# Patient Record
Sex: Female | Born: 1970 | Hispanic: Yes | Smoking: Never smoker
Health system: Southern US, Community
[De-identification: ages and names within clinical notes are randomized; demographics above are authoritative.]

## PROBLEM LIST (undated history)

## (undated) DIAGNOSIS — J45909 Unspecified asthma, uncomplicated: Secondary | ICD-10-CM

## (undated) HISTORY — DX: Unspecified asthma, uncomplicated: J45.909

---

## 2010-12-18 DIAGNOSIS — F32A Depression, unspecified: Secondary | ICD-10-CM | POA: Insufficient documentation

## 2010-12-18 DIAGNOSIS — R7303 Prediabetes: Secondary | ICD-10-CM | POA: Insufficient documentation

## 2010-12-18 DIAGNOSIS — E785 Hyperlipidemia, unspecified: Secondary | ICD-10-CM | POA: Insufficient documentation

## 2010-12-18 DIAGNOSIS — E559 Vitamin D deficiency, unspecified: Secondary | ICD-10-CM | POA: Insufficient documentation

## 2013-02-27 DIAGNOSIS — M546 Pain in thoracic spine: Secondary | ICD-10-CM | POA: Insufficient documentation

## 2015-09-07 DIAGNOSIS — R911 Solitary pulmonary nodule: Secondary | ICD-10-CM | POA: Insufficient documentation

## 2015-09-28 DIAGNOSIS — D3610 Benign neoplasm of peripheral nerves and autonomic nervous system, unspecified: Secondary | ICD-10-CM | POA: Insufficient documentation

## 2015-09-28 DIAGNOSIS — G902 Horner's syndrome: Secondary | ICD-10-CM | POA: Insufficient documentation

## 2015-10-04 DIAGNOSIS — N939 Abnormal uterine and vaginal bleeding, unspecified: Secondary | ICD-10-CM | POA: Insufficient documentation

## 2015-10-04 DIAGNOSIS — R918 Other nonspecific abnormal finding of lung field: Secondary | ICD-10-CM | POA: Insufficient documentation

## 2015-11-23 DIAGNOSIS — E669 Obesity, unspecified: Secondary | ICD-10-CM | POA: Insufficient documentation

## 2019-01-12 DIAGNOSIS — R109 Unspecified abdominal pain: Secondary | ICD-10-CM | POA: Insufficient documentation

## 2019-01-12 DIAGNOSIS — R0600 Dyspnea, unspecified: Secondary | ICD-10-CM | POA: Insufficient documentation

## 2019-02-06 DIAGNOSIS — L817 Pigmented purpuric dermatosis: Secondary | ICD-10-CM | POA: Insufficient documentation

## 2019-02-06 DIAGNOSIS — D179 Benign lipomatous neoplasm, unspecified: Secondary | ICD-10-CM | POA: Insufficient documentation

## 2019-02-06 DIAGNOSIS — Z0000 Encounter for general adult medical examination without abnormal findings: Secondary | ICD-10-CM | POA: Insufficient documentation

## 2019-12-08 ENCOUNTER — Other Ambulatory Visit: Payer: Self-pay

## 2019-12-08 ENCOUNTER — Ambulatory Visit
Admission: RE | Admit: 2019-12-08 | Discharge: 2019-12-08 | Disposition: A | Discharge status: Home / Self Care | Payer: Self-pay | Source: Ambulatory Visit | Attending: Oncology | Admitting: Oncology

## 2019-12-08 ENCOUNTER — Encounter: Payer: Self-pay | Admitting: *Deleted

## 2019-12-08 ENCOUNTER — Ambulatory Visit: Payer: Self-pay | Attending: Oncology | Admitting: *Deleted

## 2019-12-08 VITALS — BP 122/72 | HR 98 | Temp 99.1°F | Ht <= 58 in | Wt 133.0 lb

## 2019-12-08 DIAGNOSIS — Z0000 Encounter for general adult medical examination without abnormal findings: Secondary | ICD-10-CM

## 2019-12-08 NOTE — Patient Instructions (Signed)
Gave patient hand-out, Women Staying Healthy, Active and Well from [facility], with education on breast health, pap smears, heart and colon health. 

## 2019-12-08 NOTE — Progress Notes (Signed)
  Subjective:     Patient ID: Mandy Ballard, female   DOB: April 17, 1971, 49 y.o.   MRN: [id number]  HPI   [facility] Medical History Record - 12/08/19 1142      Breast History   Screening cycle New    CBE Date 12/08/14    Provider (CBE) [facility]    Initial Mammogram 12/08/19    Last Mammogram --   10-18 years ago   Provider (Mammogram)  [facility]    Recent Breast Symptoms --   sometimes she has bilateral breast pain     Breast Cancer History   Breast Cancer History No personal or family history      Previous History of Breast Problems   Breast Surgery or Biopsy None    Breast Implants N/A    BSE Done Monthly   occassionally     Gynecological/Obstetrical History   LMP --   IUD 4 years ago and has not had a period   Age at menarche 43    Age at menopause NA    PAP smear history Annually    Date of last PAP  03/10/19    Provider (PAP) [facility]    Age at first live birth 48    Breast fed children Yes (type length in comments)   6 months   Cervical, Uterine or Ovarian cancer No    Family history of Cervial, Uterine or Ovarian cancer No    Hysterectomy No    Cervix removed No    Ovaries removed No    Laser/Cryosurgery No    Current method of birth control Other (see comments)   Mirena   Current method of Estrogen/Hormone replacement None    Smoking history None             Review of Systems     Objective:   Physical Exam Chest:     Breasts:        Right: Tenderness present. No swelling, bleeding, inverted nipple, mass, nipple discharge or skin change.        Left: Tenderness present. No swelling, bleeding, inverted nipple, mass, nipple discharge or skin change.  Lymphadenopathy:     Upper Body:     Right upper body: No supraclavicular or axillary adenopathy.     Left upper body: No supraclavicular or axillary adenopathy.        Assessment:     49 year old Hispanic female presents to [facility] for clinical breast exam and mammogram.   Loyda, the interpreter present during the interview and exam.  Patient complains of occassional bilateral breast tenderness.  Clinical breast exam unremarkable. Taught self breast awareness.  Last pap smear on 03/10/19 was negative without HPV co-testing.  Next pap due in 2023.  Patient has been screened for eligibility.  She does not have any insurance, Medicare or Medicaid.  She also meets financial eligibility.   Risk Assessment    Risk Scores      12/08/2019   Last edited by: Jim Like, RN   5-year risk: 0.6 %   Lifetime risk: 5.8 %            Plan:     Screening mammogram ordered.  Will follow up per [facility] protocol.

## 2019-12-09 ENCOUNTER — Other Ambulatory Visit: Payer: Self-pay | Admitting: *Deleted

## 2019-12-09 DIAGNOSIS — N63 Unspecified lump in unspecified breast: Secondary | ICD-10-CM

## 2019-12-24 ENCOUNTER — Ambulatory Visit
Admission: RE | Admit: 2019-12-24 | Discharge: 2019-12-24 | Disposition: A | Discharge status: Home / Self Care | Payer: Self-pay | Source: Ambulatory Visit | Attending: Oncology | Admitting: Oncology

## 2019-12-24 ENCOUNTER — Other Ambulatory Visit: Payer: Self-pay

## 2019-12-24 DIAGNOSIS — N63 Unspecified lump in unspecified breast: Secondary | ICD-10-CM

## 2019-12-28 ENCOUNTER — Other Ambulatory Visit: Payer: Self-pay

## 2019-12-28 DIAGNOSIS — R920 Mammographic microcalcification found on diagnostic imaging of breast: Secondary | ICD-10-CM

## 2019-12-28 DIAGNOSIS — N63 Unspecified lump in unspecified breast: Secondary | ICD-10-CM

## 2020-01-01 ENCOUNTER — Ambulatory Visit
Admission: RE | Admit: 2020-01-01 | Discharge: 2020-01-01 | Disposition: A | Discharge status: Home / Self Care | Payer: Self-pay | Source: Ambulatory Visit | Attending: Oncology | Admitting: Oncology

## 2020-01-01 ENCOUNTER — Other Ambulatory Visit: Payer: Self-pay

## 2020-01-01 DIAGNOSIS — R920 Mammographic microcalcification found on diagnostic imaging of breast: Secondary | ICD-10-CM | POA: Insufficient documentation

## 2020-01-01 HISTORY — PX: BREAST BIOPSY: SHX20

## 2020-01-05 LAB — SURGICAL PATHOLOGY

## 2020-01-21 DIAGNOSIS — Z309 Encounter for contraceptive management, unspecified: Secondary | ICD-10-CM | POA: Insufficient documentation

## 2020-01-27 ENCOUNTER — Encounter: Payer: Self-pay | Admitting: *Deleted

## 2020-01-27 NOTE — Progress Notes (Signed)
Letter mailed to inform patient of her six month follow up mammogram on 07/05/19 @ 10:20.

## 2020-02-02 DIAGNOSIS — Z87898 Personal history of other specified conditions: Secondary | ICD-10-CM | POA: Insufficient documentation

## 2020-02-25 DIAGNOSIS — E663 Overweight: Secondary | ICD-10-CM | POA: Insufficient documentation

## 2020-02-25 DIAGNOSIS — G44209 Tension-type headache, unspecified, not intractable: Secondary | ICD-10-CM | POA: Insufficient documentation

## 2020-07-04 ENCOUNTER — Ambulatory Visit
Admission: RE | Admit: 2020-07-04 | Discharge: 2020-07-04 | Disposition: A | Discharge status: Home / Self Care | Payer: Self-pay | Source: Ambulatory Visit | Attending: Oncology | Admitting: Oncology

## 2020-07-04 ENCOUNTER — Other Ambulatory Visit: Payer: Self-pay

## 2020-07-04 DIAGNOSIS — N63 Unspecified lump in unspecified breast: Secondary | ICD-10-CM | POA: Insufficient documentation

## 2020-07-21 ENCOUNTER — Encounter: Payer: Self-pay | Admitting: *Deleted

## 2020-07-21 ENCOUNTER — Other Ambulatory Visit: Payer: Self-pay | Admitting: *Deleted

## 2020-07-21 DIAGNOSIS — N6489 Other specified disorders of breast: Secondary | ICD-10-CM

## 2020-07-21 NOTE — Progress Notes (Signed)
Letter mailed to inform patient of her mammogram results and need to return in 6 months.  Appointment scheduled for 01/03/21 @ 8:30 for [facility] and 10:00 for her mammogram.

## 2020-08-08 DIAGNOSIS — R609 Edema, unspecified: Secondary | ICD-10-CM | POA: Insufficient documentation

## 2020-08-16 DIAGNOSIS — R252 Cramp and spasm: Secondary | ICD-10-CM | POA: Insufficient documentation

## 2020-08-16 DIAGNOSIS — N912 Amenorrhea, unspecified: Secondary | ICD-10-CM | POA: Insufficient documentation

## 2020-08-16 DIAGNOSIS — R7989 Other specified abnormal findings of blood chemistry: Secondary | ICD-10-CM | POA: Insufficient documentation

## 2020-11-18 DIAGNOSIS — R059 Cough, unspecified: Secondary | ICD-10-CM | POA: Insufficient documentation

## 2021-01-03 ENCOUNTER — Ambulatory Visit
Admission: RE | Admit: 2021-01-03 | Discharge: 2021-01-03 | Disposition: A | Discharge status: Home / Self Care | Payer: Self-pay | Source: Ambulatory Visit | Attending: Oncology | Admitting: Oncology

## 2021-01-03 ENCOUNTER — Other Ambulatory Visit: Payer: Self-pay

## 2021-01-03 DIAGNOSIS — N6489 Other specified disorders of breast: Secondary | ICD-10-CM | POA: Insufficient documentation

## 2021-01-05 ENCOUNTER — Encounter: Payer: Self-pay | Admitting: *Deleted

## 2021-06-09 DIAGNOSIS — M2550 Pain in unspecified joint: Secondary | ICD-10-CM | POA: Insufficient documentation

## 2021-07-05 ENCOUNTER — Ambulatory Visit: Payer: Self-pay

## 2021-07-14 DIAGNOSIS — Z1321 Encounter for screening for nutritional disorder: Secondary | ICD-10-CM | POA: Insufficient documentation

## 2021-08-11 DIAGNOSIS — M7701 Medial epicondylitis, right elbow: Secondary | ICD-10-CM | POA: Insufficient documentation

## 2021-09-22 DIAGNOSIS — H04129 Dry eye syndrome of unspecified lacrimal gland: Secondary | ICD-10-CM | POA: Insufficient documentation

## 2022-01-03 ENCOUNTER — Ambulatory Visit: Payer: Self-pay

## 2022-01-30 ENCOUNTER — Ambulatory Visit: Payer: Self-pay

## 2022-02-02 ENCOUNTER — Other Ambulatory Visit: Payer: Self-pay

## 2022-02-02 DIAGNOSIS — N6489 Other specified disorders of breast: Secondary | ICD-10-CM

## 2022-02-06 ENCOUNTER — Ambulatory Visit
Admission: RE | Admit: 2022-02-06 | Discharge: 2022-02-06 | Disposition: A | Discharge status: Home / Self Care | Payer: Self-pay | Source: Ambulatory Visit | Attending: Obstetrics and Gynecology | Admitting: Obstetrics and Gynecology

## 2022-02-06 ENCOUNTER — Other Ambulatory Visit: Payer: Self-pay

## 2022-02-06 ENCOUNTER — Ambulatory Visit: Payer: Self-pay | Attending: Hematology and Oncology | Admitting: Hematology and Oncology

## 2022-02-06 VITALS — BP 148/97 | Wt 164.0 lb

## 2022-02-06 DIAGNOSIS — N6489 Other specified disorders of breast: Secondary | ICD-10-CM

## 2022-02-06 DIAGNOSIS — Z1211 Encounter for screening for malignant neoplasm of colon: Secondary | ICD-10-CM

## 2022-02-06 NOTE — Progress Notes (Signed)
Mandy Ballard is a 51 y.o. female who presents to [facility] clinic today with complaint of right axillary lumps that are enlarging.    Pap Smear: Pap not smear completed today. Last Pap smear was 03/10/19 at [facility] clinic and was normal. Per patient has no history of an abnormal Pap smear. Last Pap smear result is available in Epic.   Physical exam: Breasts Breasts symmetrical. No skin abnormalities bilateral breasts. No nipple retraction bilateral breasts. No nipple discharge bilateral breasts. No lymphadenopathy. No lumps palpated bilateral breasts. Right axillary lymphadenopathy noted.  MS DIGITAL DIAG TOMO BILAT  Result Date: 01/03/2021 CLINICAL DATA:  Six-month follow-up for probably benign asymmetry in the upper-outer right breast. Patient also reports focal pain at the central upper left breast. EXAM: DIGITAL DIAGNOSTIC BILATERAL MAMMOGRAM WITH TOMOSYNTHESIS AND CAD; ULTRASOUND LEFT BREAST LIMITED TECHNIQUE: Bilateral digital diagnostic mammography and breast tomosynthesis was performed. The images were evaluated with computer-aided detection.; Targeted ultrasound examination of the left breast was performed. COMPARISON:  Previous exam(s). ACR Breast Density Category b: There are scattered areas of fibroglandular density. FINDINGS: RIGHT breast: Stable appearance of the asymmetry in the upper-outer right breast at anterior depth compared to mammogram 12/08/2019. No new suspicious masses, calcifications, or other findings in the right breast. LEFT breast: A metallic BB applied to the skin denotes the patient indicated area of focal pain in the upper left breast. No underlying mammographic abnormality. Interval post biopsy changes with an X shaped clip marking site of previous benign biopsy in the upper-outer left breast at middle depth. On physical examination of the upper left breast at area of focal pain, I do not appreciate a discrete mass. Targeted ultrasound of the left breast at the  10 o'clock and 12 o'clock positions 7 cm from the nipple, at the patient indicated area of focal pain, was performed. No suspicious solid or cystic mass. IMPRESSION: RIGHT breast: Documented 1 year stability of probably benign asymmetry in the upper-outer breast, likely asymmetric fibroglandular tissue. Recommend bilateral diagnostic mammogram in 1 year to establish 2 years of stability. LEFT breast: No mammographic or sonographic abnormality at the patient indicated area of focal pain in the upper left breast. RECOMMENDATION: Bilateral diagnostic mammogram in 1 year. Any further workup of the patient's symptoms in the left breast should be based on the clinical assessment. I have discussed the findings and recommendations with the patient. If applicable, a reminder letter will be sent to the patient regarding the next appointment. BI-RADS CATEGORY  3: Probably benign. Electronically Signed   By: Ileana Roup M.D.   On: 01/03/2021 12:52  MS DIGITAL DIAG TOMO UNI RIGHT  Result Date: 07/04/2020 CLINICAL DATA:  Follow-up for probably benign asymmetry within the outer RIGHT breast. Patient also describes bilateral breast pain and bilateral non-spontaneous nipple discharge which is yellowish in color. EXAM: DIGITAL DIAGNOSTIC UNILATERAL RIGHT MAMMOGRAM WITH TOMOSYNTHESIS AND CAD TECHNIQUE: Right digital diagnostic mammography and breast tomosynthesis was performed. The images were evaluated with computer-aided detection. COMPARISON:  Previous exams including RIGHT breast diagnostic mammogram dated 12/24/2019. ACR Breast Density Category b: There are scattered areas of fibroglandular density. FINDINGS: The previously described RIGHT breast asymmetry is stable, again most suggestive of an [place] of normal dense fibroglandular tissue. There are no new dominant masses, suspicious calcifications or secondary signs of malignancy within the RIGHT breast. IMPRESSION: Stable probably benign asymmetry within the upper outer  quadrant of the RIGHT breast, most likely an [place] of normal dense fibroglandular tissue. Recommend additional follow-up  diagnostic mammogram in August of 2022 to ensure continued stability (1 year stability). Patient will be due for a routine annual mammogram of the LEFT breast at that time. RECOMMENDATION: 1. Bilateral diagnostic mammogram in August of 2022. 2. Patient was reassured that generalized bilateral breast pain and bilateral non-spontaneous yellowish nipple discharge was not an indication of underlying malignancy. However, breast cancer is a possibility when unilateral spontaneous persistent single duct discharge (especially bloody or clear discharge) is present and patient was instructed to return for additional imaging if this type of nipple discharge was identified. I have discussed the findings and recommendations with the patient via an interpreter. If applicable, a reminder letter will be sent to the patient regarding the next appointment. BI-RADS CATEGORY  3: Probably benign. Electronically Signed   By: Bary Richard M.D.   On: 07/04/2020 11:01   MM LT BREAST BX W LOC DEV 1ST LESION IMAGE BX SPEC STEREO GUIDE  Addendum Date: 01/06/2020   ADDENDUM REPORT: 01/06/2020 10:02 ADDENDUM: PATHOLOGY revealed: A. BREAST, LEFT UPPER OUTER QUADRANT; STEREOTACTIC CORE NEEDLE BIOPSY: - BENIGN MAMMARY PARENCHYMA WITH STROMAL FIBROSIS, FIBROADENOMATOID CHANGES, SCLEROSING ADENOSIS, AND ASSOCIATED DYSTROPHIC CALCIFICATIONS. - NEGATIVE FOR ATYPICAL PROLIFERATIVE BREAST DISEASE. Pathology results are CONCORDANT with imaging findings, per Dr. Bary Richard. Pathology results and recommendations below were discussed with patient using [address] [place] # [phone]) by telephone on 01/06/2020. Patient reported biopsy site within normal limits with slight tenderness at the site. Post biopsy care instructions were reviewed, questions were answered and my direct phone number was provided to patient. Patient was  instructed to call [facility] if any concerns or questions arise related to the biopsy. Recommendation: Patient instructed to return in six months for unilateral RIGHT breast diagnostic mammogram and possible ultrasound for probable benign RIGHT breast asymmetry. Patient informed a reminder notice will be sent regarding this appointment. Pathology results reported by Randa Lynn RN on 01/06/2020. Electronically Signed   By: Bary Richard M.D.   On: 01/06/2020 10:02   Result Date: 01/06/2020 CLINICAL DATA:  Patient with indeterminate calcifications in the upper outer quadrant of the LEFT breast presents today for stereotactic biopsy. EXAM: LEFT BREAST STEREOTACTIC CORE NEEDLE BIOPSY COMPARISON:  Previous exams. FINDINGS: The patient and I discussed the procedure of stereotactic-guided biopsy including benefits and alternatives. We discussed the high likelihood of a successful procedure. We discussed the risks of the procedure including infection, bleeding, tissue injury, clip migration, and inadequate sampling. Informed written consent was given. The usual time out protocol was performed immediately prior to the procedure. Using sterile technique and 1% Lidocaine as local anesthetic, under stereotactic guidance, a 9 gauge vacuum assisted device was used to perform core needle biopsy of calcifications in the upper outer quadrant of the left breast using a superior approach. Specimen radiograph was performed showing calcifications. Specimens with calcifications are identified for pathology. Lesion quadrant: Upper outer quadrant At the conclusion of the procedure, X shaped tissue marker clip was deployed into the biopsy cavity. Follow-up 2-view mammogram was performed and dictated separately. IMPRESSION: Stereotactic-guided biopsy of indeterminate calcifications within the upper-outer quadrant of the LEFT breast. No apparent complications. Electronically Signed: By: Bary Richard M.D. On: 01/01/2020 09:38    MS CLIP PLACEMENT LEFT  Result Date: 01/01/2020 CLINICAL DATA:  Status post stereotactic biopsy for calcifications in the LEFT breast. EXAM: DIAGNOSTIC LEFT MAMMOGRAM POST STEREOTACTIC BIOPSY COMPARISON:  Previous exam(s). FINDINGS: Mammographic images were obtained following stereotactic guided biopsy of indeterminate calcifications within the upper-outer quadrant of  the LEFT breast. The biopsy marking clip is in expected position at the site of biopsy. IMPRESSION: Appropriate positioning of the X shaped biopsy marking clip at the site of biopsy in the upper-outer quadrant of the LEFT breast. Final Assessment: Post Procedure Mammograms for Marker Placement Electronically Signed   By: Bary Richard M.D.   On: 01/01/2020 09:47   MS DIGITAL DIAG TOMO BILAT  Result Date: 12/24/2019 CLINICAL DATA:  Patient recalled from screening for right breast asymmetry and left breast calcifications. EXAM: DIGITAL DIAGNOSTIC BILATERAL MAMMOGRAM WITH CAD AND TOMO ULTRASOUND LEFT BREAST COMPARISON:  Previous exam(s). ACR Breast Density Category b: There are scattered areas of fibroglandular density. FINDINGS: Asymmetry within the upper-outer right breast middle depth partially effaced with additional imaging suggestive of dense fibroglandular tissue. Within the upper-outer left breast middle depth there is a 14 mm group of coarse heterogeneous calcifications, further evaluated with magnification cc and true lateral views. Mammographic images were processed with CAD. Targeted ultrasound is performed, showing normal dense tissue without suspicious mass within the upper-outer right breast. IMPRESSION: Indeterminate left breast calcifications. Probably benign asymmetry upper outer right breast. RECOMMENDATION: Stereotactic guided core needle biopsy left breast calcifications. Right breast diagnostic mammogram and possible ultrasound in 6 months to reassess probably benign right breast asymmetry. I have discussed the findings and  recommendations with the patient. If applicable, a reminder letter will be sent to the patient regarding the next appointment. BI-RADS CATEGORY  4: Suspicious. Electronically Signed   By: Annia Belt M.D.   On: 12/24/2019 14:41   MS DIGITAL SCREENING TOMO BILATERAL  Result Date: 12/08/2019 CLINICAL DATA:  Screening. EXAM: DIGITAL SCREENING BILATERAL MAMMOGRAM WITH TOMO AND CAD COMPARISON:  None. ACR Breast Density Category b: There are scattered areas of fibroglandular density. FINDINGS: In the right breast there is a focal asymmetry in the upper outer breast middle depth which requires further evaluation. It is more conspicuous on the CC slice 44. In the left breast, calcifications warrant further evaluation. There is seen in the LEFT upper outer breast, middle depth. In the right breast, no findings suspicious for malignancy. Images were processed with CAD. IMPRESSION: Further evaluation is suggested for possible focal asymmetry in the right breast. Further evaluation is suggested for possible calcifications in the left breast. RECOMMENDATION: Diagnostic mammogram and possibly ultrasound of both breasts. (Code:[id number]) The patient will be contacted regarding the findings, and additional imaging will be scheduled. BI-RADS CATEGORY  0: Incomplete. Need additional imaging evaluation and/or prior mammograms for comparison. Electronically Signed   By: Meda Klinefelter MD   On: 12/08/2019 12:55      Pelvic/Bimanual Pap is not indicated today    Smoking History: Patient has never smoked and was not referred to quit line.    Patient Navigation: Patient education provided. Access to services provided for patient through [facility] program. Delos Haring interpreter provided. No transportation provided   Colorectal Cancer Screening: Per patient has never had colonoscopy completed No complaints today. FIT test given   Breast and Cervical Cancer Risk Assessment: Patient does not have family history of breast  cancer, known genetic mutations, or radiation treatment to the chest before age 50. Patient does not have history of cervical dysplasia, immunocompromised, or DES exposure in-utero.  Risk Assessment   No risk assessment data for the current encounter  Risk Scores       12/08/2019   Last edited by: Jim Like, RN   5-year risk: 0.6 %   Lifetime risk: 5.8 %  A: [facility] exam without pap smear Complaint of right axillary lymph node enlarging with tenderness.   P: [facility]. Appointment scheduled 02/06/22.  Pascal Lux, NP 02/06/2022 9:43 AM

## 2022-12-03 ENCOUNTER — Other Ambulatory Visit: Payer: Self-pay

## 2022-12-03 DIAGNOSIS — Z1231 Encounter for screening mammogram for malignant neoplasm of breast: Secondary | ICD-10-CM

## 2023-01-06 IMAGING — MG DIGITAL DIAGNOSTIC BILAT W/ TOMO W/ CAD
6 of 10 series · 6 of 30 positions shown · non-contrast
Comparison: Previous exam(s).

CLINICAL DATA: Six-month follow-up for probably benign asymmetry in
the upper-outer right breast. Patient also reports focal pain at the
central upper left breast.

EXAM:
DIGITAL DIAGNOSTIC BILATERAL MAMMOGRAM WITH TOMOSYNTHESIS AND CAD;
ULTRASOUND LEFT BREAST LIMITED
TECHNIQUE: Bilateral digital diagnostic mammography and breast tomosynthesis
was performed. The images were evaluated with computer-aided
detection.; Targeted ultrasound examination of the left breast was
performed.

[L CC synth-2D]
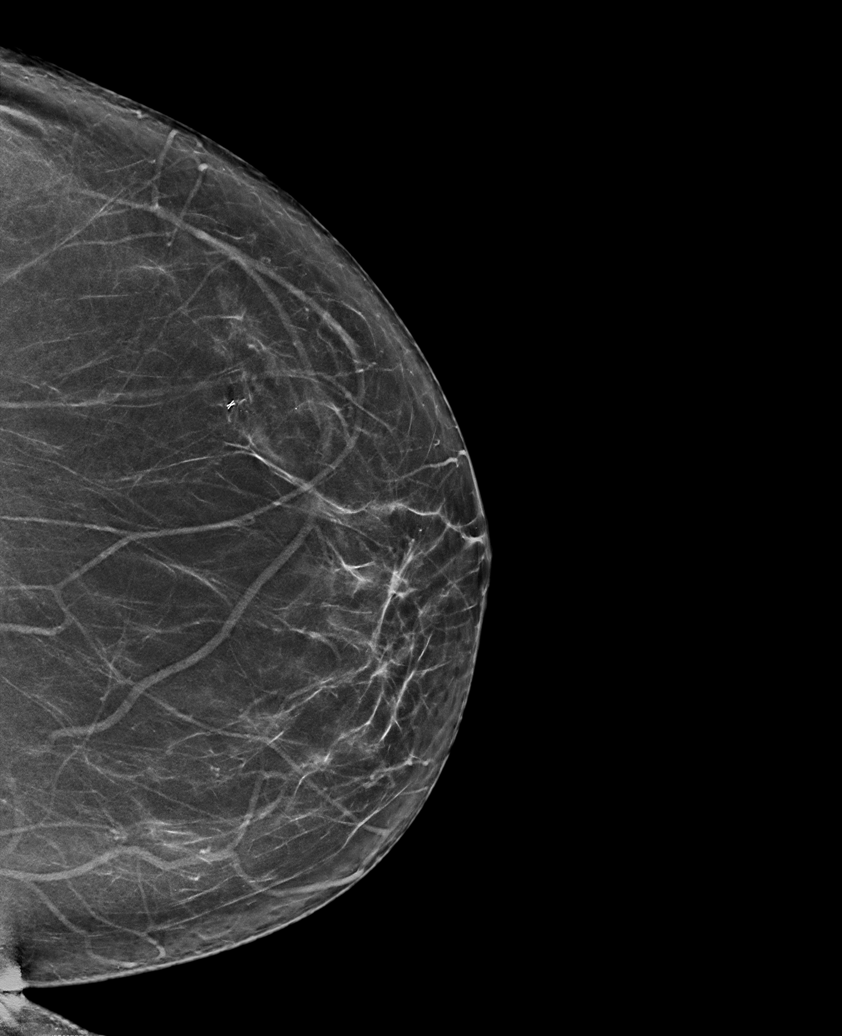

[R CC synth-2D]
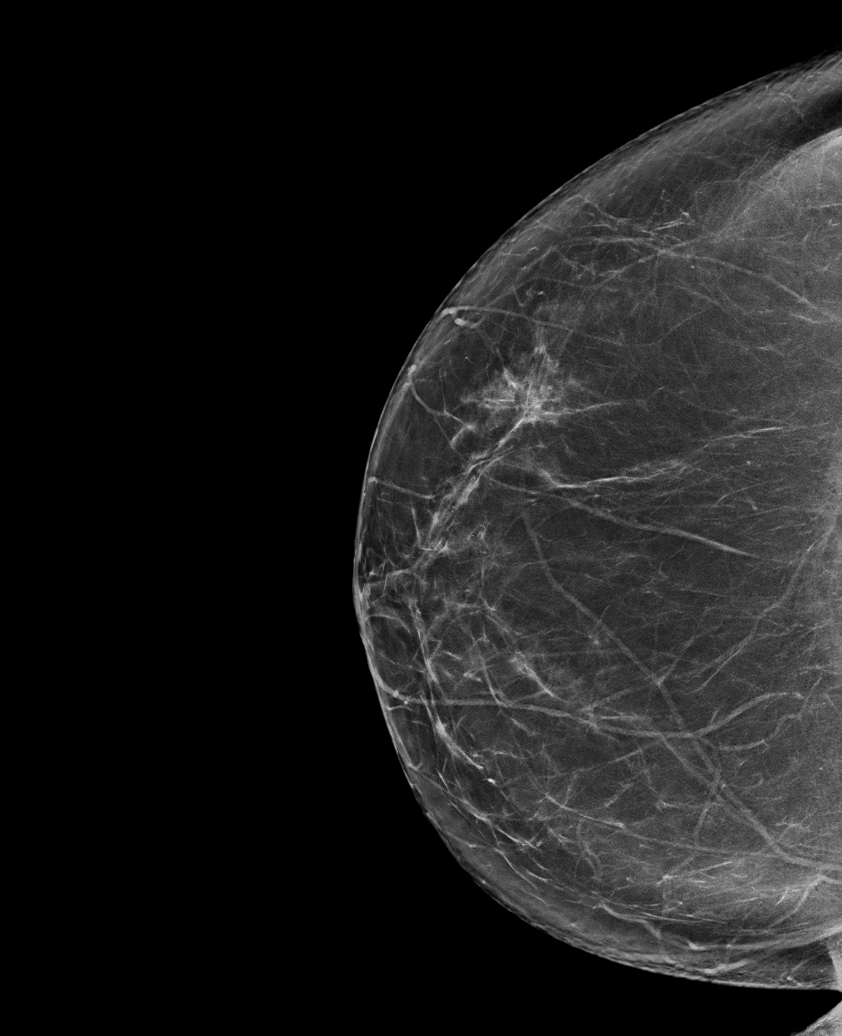

[R MLO synth-2D]
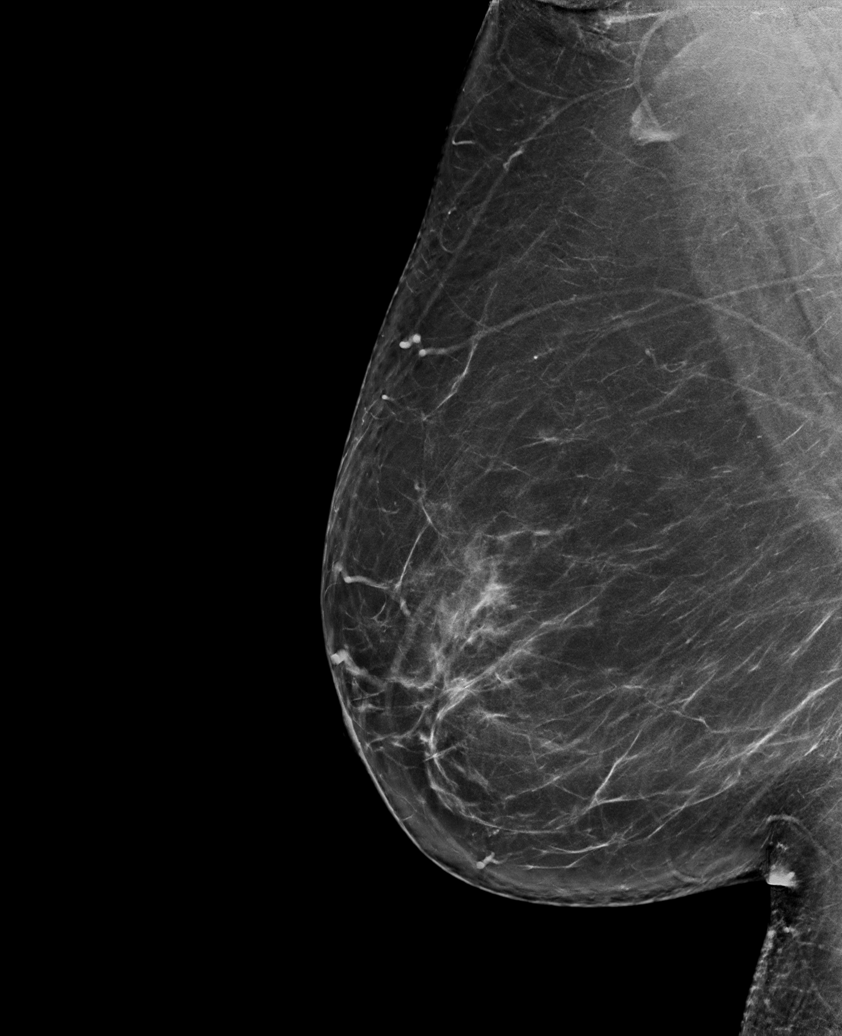

[L MLO synth-2D (1 of 2)]
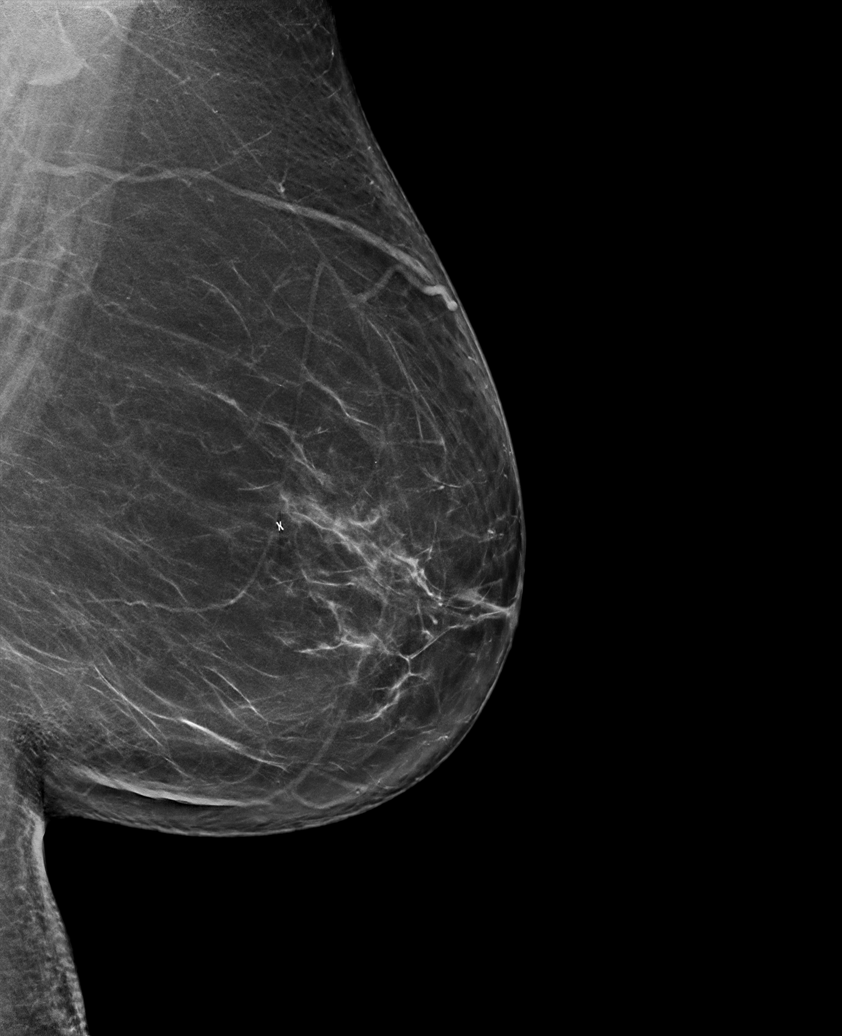

[L MLO synth-2D (2 of 2)]
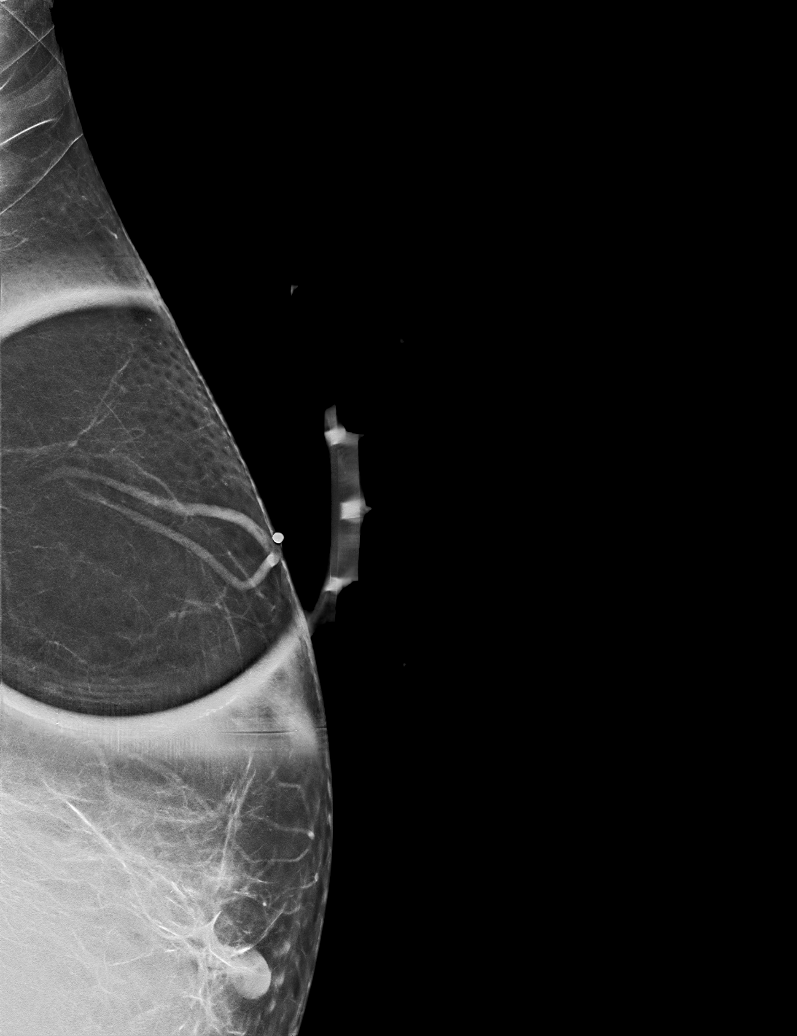

[L MLO tomo · tomo slice 43/86.0]
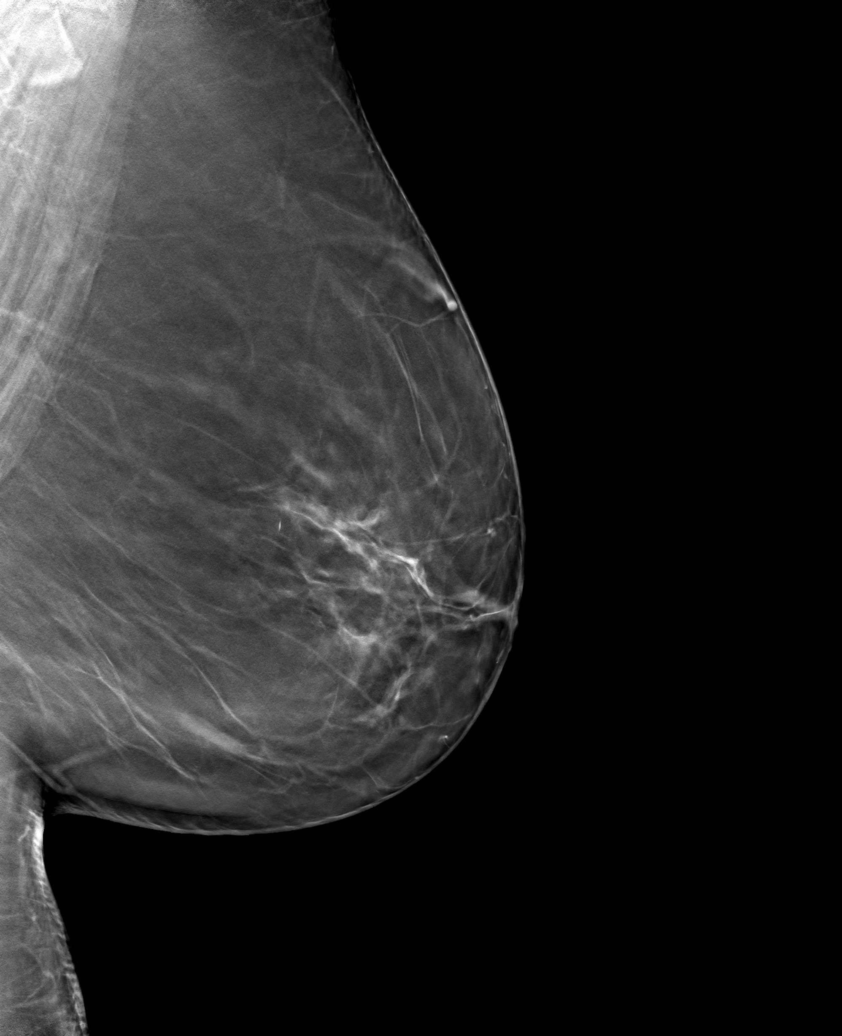

[6 of 30 positions shown; findings below may reference images not displayed]

ACR Breast Density Category b: There are scattered areas of
fibroglandular density.
FINDINGS: RIGHT breast: Stable appearance of the asymmetry in the upper-outer
right breast at anterior depth compared to mammogram 12/08/2019. No
new suspicious masses, calcifications, or other findings in the
right breast.

LEFT breast: A metallic BB applied to the skin denotes the patient
indicated area of focal pain in the upper left breast. No underlying
mammographic abnormality. Interval post biopsy changes with an X
shaped clip marking site of previous benign biopsy in the
upper-outer left breast at middle depth.

On physical examination of the upper left breast at area of focal
pain, I do not appreciate a discrete mass.

Targeted ultrasound of the left breast at the 10 o'clock and 12
o'clock positions 7 cm from the nipple, at the patient indicated
area of focal pain, was performed. No suspicious solid or cystic
mass.
IMPRESSION: RIGHT breast: Documented 1 year stability of probably benign
asymmetry in the upper-outer breast, likely asymmetric
fibroglandular tissue. Recommend bilateral diagnostic mammogram in 1
year to establish 2 years of stability.

LEFT breast: No mammographic or sonographic abnormality at the
patient indicated area of focal pain in the upper left breast.

RECOMMENDATION:
Bilateral diagnostic mammogram in 1 year.

Any further workup of the patient's symptoms in the left breast
should be based on the clinical assessment.

I have discussed the findings and recommendations with the patient.
If applicable, a reminder letter will be sent to the patient
regarding the next appointment.

BI-RADS CATEGORY  3: Probably benign.

## 2023-03-04 ENCOUNTER — Ambulatory Visit: Payer: Self-pay | Attending: Hematology and Oncology | Admitting: Hematology and Oncology

## 2023-03-04 ENCOUNTER — Other Ambulatory Visit: Payer: Self-pay

## 2023-03-04 ENCOUNTER — Ambulatory Visit
Admission: RE | Admit: 2023-03-04 | Discharge: 2023-03-04 | Disposition: A | Discharge status: Home / Self Care | Payer: Self-pay | Source: Ambulatory Visit | Attending: Obstetrics and Gynecology | Admitting: Obstetrics and Gynecology

## 2023-03-04 VITALS — BP 130/70 | Wt 154.7 lb

## 2023-03-04 DIAGNOSIS — Z1231 Encounter for screening mammogram for malignant neoplasm of breast: Secondary | ICD-10-CM

## 2023-03-04 DIAGNOSIS — Z1211 Encounter for screening for malignant neoplasm of colon: Secondary | ICD-10-CM

## 2023-03-04 NOTE — Progress Notes (Signed)
Mandy Ballard is a 52 y.o. female who presents to [facility] clinic today with no complaints.    Pap Smear: Pap not smear completed today. Last Pap smear was 03/10/2019 at [facility] clinic and was normal. Per patient has no history of an abnormal Pap smear. Last Pap smear result is available in Epic. She will follow up with [facility] for Pap smear.    Physical exam: Breasts Breasts symmetrical. No skin abnormalities bilateral breasts. No nipple retraction bilateral breasts. No nipple discharge bilateral breasts. No lymphadenopathy. No lumps palpated bilateral breasts.  MS DIGITAL DIAG TOMO BILAT  Result Date: 02/06/2022 CLINICAL DATA:  Patient presents for final 2 year follow-up for probably benign right breast asymmetry. EXAM: DIGITAL DIAGNOSTIC BILATERAL MAMMOGRAM WITH TOMOSYNTHESIS TECHNIQUE: Bilateral digital diagnostic mammography and breast tomosynthesis was performed. COMPARISON:  Previous exam(s). ACR Breast Density Category b: There are scattered areas of fibroglandular density. FINDINGS: No suspicious masses or calcifications are seen in either breast. The asymmetry in the outer right breast demonstrating imaging features most consistent with benign fibroglandular tissue is unchanged. This is considered benign given greater than 2 years of stability. There is no mammographic evidence of malignancy in either breast. IMPRESSION: No mammographic evidence of malignancy in either breast. RECOMMENDATION: Screening mammogram in one year.(Code:SM-B-01Y) I have discussed the findings and recommendations with the patient. If applicable, a reminder letter will be sent to the patient regarding the next appointment. BI-RADS CATEGORY  1: Negative. Electronically Signed   By: Edwin Cap M.D.   On: 02/06/2022 10:44   MS DIGITAL DIAG TOMO BILAT  Result Date: 01/03/2021 CLINICAL DATA:  Six-month follow-up for probably benign asymmetry in the upper-outer right breast. Patient also reports  focal pain at the central upper left breast. EXAM: DIGITAL DIAGNOSTIC BILATERAL MAMMOGRAM WITH TOMOSYNTHESIS AND CAD; ULTRASOUND LEFT BREAST LIMITED TECHNIQUE: Bilateral digital diagnostic mammography and breast tomosynthesis was performed. The images were evaluated with computer-aided detection.; Targeted ultrasound examination of the left breast was performed. COMPARISON:  Previous exam(s). ACR Breast Density Category b: There are scattered areas of fibroglandular density. FINDINGS: RIGHT breast: Stable appearance of the asymmetry in the upper-outer right breast at anterior depth compared to mammogram 12/08/2019. No new suspicious masses, calcifications, or other findings in the right breast. LEFT breast: A metallic BB applied to the skin denotes the patient indicated area of focal pain in the upper left breast. No underlying mammographic abnormality. Interval post biopsy changes with an X shaped clip marking site of previous benign biopsy in the upper-outer left breast at middle depth. On physical examination of the upper left breast at area of focal pain, I do not appreciate a discrete mass. Targeted ultrasound of the left breast at the 10 o'clock and 12 o'clock positions 7 cm from the nipple, at the patient indicated area of focal pain, was performed. No suspicious solid or cystic mass. IMPRESSION: RIGHT breast: Documented 1 year stability of probably benign asymmetry in the upper-outer breast, likely asymmetric fibroglandular tissue. Recommend bilateral diagnostic mammogram in 1 year to establish 2 years of stability. LEFT breast: No mammographic or sonographic abnormality at the patient indicated area of focal pain in the upper left breast. RECOMMENDATION: Bilateral diagnostic mammogram in 1 year. Any further workup of the patient's symptoms in the left breast should be based on the clinical assessment. I have discussed the findings and recommendations with the patient. If applicable, a reminder letter will  be sent to the patient regarding the next appointment. BI-RADS CATEGORY  3: Probably benign. Electronically Signed   By: Sherron Ales M.D.   On: 01/03/2021 12:52  MS DIGITAL DIAG TOMO UNI RIGHT  Result Date: 07/04/2020 CLINICAL DATA:  Follow-up for probably benign asymmetry within the outer RIGHT breast. Patient also describes bilateral breast pain and bilateral non-spontaneous nipple discharge which is yellowish in color. EXAM: DIGITAL DIAGNOSTIC UNILATERAL RIGHT MAMMOGRAM WITH TOMOSYNTHESIS AND CAD TECHNIQUE: Right digital diagnostic mammography and breast tomosynthesis was performed. The images were evaluated with computer-aided detection. COMPARISON:  Previous exams including RIGHT breast diagnostic mammogram dated 12/24/2019. ACR Breast Density Category b: There are scattered areas of fibroglandular density. FINDINGS: The previously described RIGHT breast asymmetry is stable, again most suggestive of an [place] of normal dense fibroglandular tissue. There are no new dominant masses, suspicious calcifications or secondary signs of malignancy within the RIGHT breast. IMPRESSION: Stable probably benign asymmetry within the upper outer quadrant of the RIGHT breast, most likely an [place] of normal dense fibroglandular tissue. Recommend additional follow-up diagnostic mammogram in August of 2022 to ensure continued stability (1 year stability). Patient will be due for a routine annual mammogram of the LEFT breast at that time. RECOMMENDATION: 1. Bilateral diagnostic mammogram in August of 2022. 2. Patient was reassured that generalized bilateral breast pain and bilateral non-spontaneous yellowish nipple discharge was not an indication of underlying malignancy. However, breast cancer is a possibility when unilateral spontaneous persistent single duct discharge (especially bloody or clear discharge) is present and patient was instructed to return for additional imaging if this type of nipple discharge was identified.  I have discussed the findings and recommendations with the patient via an interpreter. If applicable, a reminder letter will be sent to the patient regarding the next appointment. BI-RADS CATEGORY  3: Probably benign. Electronically Signed   By: Bary Richard M.D.   On: 07/04/2020 11:01   MM LT BREAST BX W LOC DEV 1ST LESION IMAGE BX SPEC STEREO GUIDE  Addendum Date: 01/06/2020   ADDENDUM REPORT: 01/06/2020 10:02 ADDENDUM: PATHOLOGY revealed: A. BREAST, LEFT UPPER OUTER QUADRANT; STEREOTACTIC CORE NEEDLE BIOPSY: - BENIGN MAMMARY PARENCHYMA WITH STROMAL FIBROSIS, FIBROADENOMATOID CHANGES, SCLEROSING ADENOSIS, AND ASSOCIATED DYSTROPHIC CALCIFICATIONS. - NEGATIVE FOR ATYPICAL PROLIFERATIVE BREAST DISEASE. Pathology results are CONCORDANT with imaging findings, per Dr. Bary Richard. Pathology results and recommendations below were discussed with patient using [address] [place] # [phone]) by telephone on 01/06/2020. Patient reported biopsy site within normal limits with slight tenderness at the site. Post biopsy care instructions were reviewed, questions were answered and my direct phone number was provided to patient. Patient was instructed to call [facility] if any concerns or questions arise related to the biopsy. Recommendation: Patient instructed to return in six months for unilateral RIGHT breast diagnostic mammogram and possible ultrasound for probable benign RIGHT breast asymmetry. Patient informed a reminder notice will be sent regarding this appointment. Pathology results reported by Randa Lynn RN on 01/06/2020. Electronically Signed   By: Bary Richard M.D.   On: 01/06/2020 10:02   Result Date: 01/06/2020 CLINICAL DATA:  Patient with indeterminate calcifications in the upper outer quadrant of the LEFT breast presents today for stereotactic biopsy. EXAM: LEFT BREAST STEREOTACTIC CORE NEEDLE BIOPSY COMPARISON:  Previous exams. FINDINGS: The patient and I discussed the procedure of  stereotactic-guided biopsy including benefits and alternatives. We discussed the high likelihood of a successful procedure. We discussed the risks of the procedure including infection, bleeding, tissue injury, clip migration, and inadequate sampling. Informed written consent was given. The usual time  out protocol was performed immediately prior to the procedure. Using sterile technique and 1% Lidocaine as local anesthetic, under stereotactic guidance, a 9 gauge vacuum assisted device was used to perform core needle biopsy of calcifications in the upper outer quadrant of the left breast using a superior approach. Specimen radiograph was performed showing calcifications. Specimens with calcifications are identified for pathology. Lesion quadrant: Upper outer quadrant At the conclusion of the procedure, X shaped tissue marker clip was deployed into the biopsy cavity. Follow-up 2-view mammogram was performed and dictated separately. IMPRESSION: Stereotactic-guided biopsy of indeterminate calcifications within the upper-outer quadrant of the LEFT breast. No apparent complications. Electronically Signed: By: Bary Richard M.D. On: 01/01/2020 09:38   MS CLIP PLACEMENT LEFT  Result Date: 01/01/2020 CLINICAL DATA:  Status post stereotactic biopsy for calcifications in the LEFT breast. EXAM: DIAGNOSTIC LEFT MAMMOGRAM POST STEREOTACTIC BIOPSY COMPARISON:  Previous exam(s). FINDINGS: Mammographic images were obtained following stereotactic guided biopsy of indeterminate calcifications within the upper-outer quadrant of the LEFT breast. The biopsy marking clip is in expected position at the site of biopsy. IMPRESSION: Appropriate positioning of the X shaped biopsy marking clip at the site of biopsy in the upper-outer quadrant of the LEFT breast. Final Assessment: Post Procedure Mammograms for Marker Placement Electronically Signed   By: Bary Richard M.D.   On: 01/01/2020 09:47   MS DIGITAL DIAG TOMO BILAT  Result Date:  12/24/2019 CLINICAL DATA:  Patient recalled from screening for right breast asymmetry and left breast calcifications. EXAM: DIGITAL DIAGNOSTIC BILATERAL MAMMOGRAM WITH CAD AND TOMO ULTRASOUND LEFT BREAST COMPARISON:  Previous exam(s). ACR Breast Density Category b: There are scattered areas of fibroglandular density. FINDINGS: Asymmetry within the upper-outer right breast middle depth partially effaced with additional imaging suggestive of dense fibroglandular tissue. Within the upper-outer left breast middle depth there is a 14 mm group of coarse heterogeneous calcifications, further evaluated with magnification cc and true lateral views. Mammographic images were processed with CAD. Targeted ultrasound is performed, showing normal dense tissue without suspicious mass within the upper-outer right breast. IMPRESSION: Indeterminate left breast calcifications. Probably benign asymmetry upper outer right breast. RECOMMENDATION: Stereotactic guided core needle biopsy left breast calcifications. Right breast diagnostic mammogram and possible ultrasound in 6 months to reassess probably benign right breast asymmetry. I have discussed the findings and recommendations with the patient. If applicable, a reminder letter will be sent to the patient regarding the next appointment. BI-RADS CATEGORY  4: Suspicious. Electronically Signed   By: Annia Belt M.D.   On: 12/24/2019 14:41   MS DIGITAL SCREENING TOMO BILATERAL  Result Date: 12/08/2019 CLINICAL DATA:  Screening. EXAM: DIGITAL SCREENING BILATERAL MAMMOGRAM WITH TOMO AND CAD COMPARISON:  None. ACR Breast Density Category b: There are scattered areas of fibroglandular density. FINDINGS: In the right breast there is a focal asymmetry in the upper outer breast middle depth which requires further evaluation. It is more conspicuous on the CC slice 44. In the left breast, calcifications warrant further evaluation. There is seen in the LEFT upper outer breast, middle depth. In  the right breast, no findings suspicious for malignancy. Images were processed with CAD. IMPRESSION: Further evaluation is suggested for possible focal asymmetry in the right breast. Further evaluation is suggested for possible calcifications in the left breast. RECOMMENDATION: Diagnostic mammogram and possibly ultrasound of both breasts. (Code:[id number]-1M) The patient will be contacted regarding the findings, and additional imaging will be scheduled. BI-RADS CATEGORY  0: Incomplete. Need additional imaging evaluation and/or prior mammograms for comparison.  Electronically Signed   By: Meda Klinefelter MD   On: 12/08/2019 12:55         Pelvic/Bimanual Pap is not indicated today    Smoking History: Patient has never smoked and was not referred to quit line.    Patient Navigation: Patient education provided. Access to services provided for patient through B[facility]CP program. [place] interpreter provided. No transportation provided   Colorectal Cancer Screening: Per patient has never had colonoscopy completed No complaints today. FIT test given.    Breast and Cervical Cancer Risk Assessment: Patient does not have family history of breast cancer, known genetic mutations, or radiation treatment to the chest before age 73. Patient does not have history of cervical dysplasia, immunocompromised, or DES exposure in-utero.   Risk Scores as of Encounter on 03/04/2023     Dondra Spry           5-year 0.97%   Lifetime 8.5%   This patient is [place]/Lat[place] but has no documented birth country, so the [place] model used data from [place] patients to calculate their risk score. Document a birth country in the Demographics activity for a more accurate score.         Last calculated by Narda Rutherford, LPN on 14/10/8293 at 10:33 AM         A: [facility]CP exam without pap smear No complaints with benign exam.   P: Referred patient to the [facility] for a screening mammogram. Appointment scheduled  03/04/23.  Ilda Basset A, NP 03/04/2023 10:18 AM

## 2023-03-04 NOTE — Patient Instructions (Signed)
Taught Chauncy Lean about self  and gave educational materials to take home. Patient did not need a Pap smear today due to last Pap smear was in 03/10/19 per patient. Let her know [facility]CP will cover Pap smears every 5 years unless has a history of abnormal Pap smears. Referred patient to the [facility] for mammogram. Appointment scheduled for 03/04/23. Patient aware of appointment and will be there. Let patient know will follow up with her within the next couple weeks with results. Chauncy Lean verbalized understanding.  Pascal Lux, NP 10:19 AM

## 2023-03-07 ENCOUNTER — Other Ambulatory Visit: Payer: Self-pay

## 2023-03-07 ENCOUNTER — Other Ambulatory Visit: Payer: Self-pay | Admitting: Obstetrics and Gynecology

## 2023-03-07 DIAGNOSIS — R928 Other abnormal and inconclusive findings on diagnostic imaging of breast: Secondary | ICD-10-CM

## 2023-03-11 ENCOUNTER — Ambulatory Visit
Admission: RE | Admit: 2023-03-11 | Discharge: 2023-03-11 | Disposition: A | Discharge status: Home / Self Care | Payer: Self-pay | Source: Ambulatory Visit | Attending: Obstetrics and Gynecology | Admitting: Obstetrics and Gynecology

## 2023-03-11 ENCOUNTER — Other Ambulatory Visit: Payer: Self-pay

## 2023-03-11 DIAGNOSIS — R928 Other abnormal and inconclusive findings on diagnostic imaging of breast: Secondary | ICD-10-CM | POA: Insufficient documentation

## 2023-03-21 DIAGNOSIS — F430 Acute stress reaction: Secondary | ICD-10-CM | POA: Insufficient documentation

## 2023-03-21 LAB — FECAL OCCULT BLOOD, IMMUNOCHEMICAL: Fecal Occult Bld: NEGATIVE

## 2023-05-03 DIAGNOSIS — N951 Menopausal and female climacteric states: Secondary | ICD-10-CM | POA: Insufficient documentation

## 2023-07-04 DIAGNOSIS — N3090 Cystitis, unspecified without hematuria: Secondary | ICD-10-CM | POA: Insufficient documentation

## 2023-07-04 DIAGNOSIS — N12 Tubulo-interstitial nephritis, not specified as acute or chronic: Secondary | ICD-10-CM | POA: Insufficient documentation

## 2023-09-03 DIAGNOSIS — R09A2 Foreign body sensation, throat: Secondary | ICD-10-CM | POA: Insufficient documentation

## 2023-09-03 DIAGNOSIS — R0789 Other chest pain: Secondary | ICD-10-CM | POA: Insufficient documentation

## 2024-04-10 ENCOUNTER — Other Ambulatory Visit: Payer: Self-pay

## 2024-04-10 DIAGNOSIS — Z1231 Encounter for screening mammogram for malignant neoplasm of breast: Secondary | ICD-10-CM

## 2024-04-17 ENCOUNTER — Other Ambulatory Visit: Payer: Self-pay | Admitting: Family Medicine

## 2024-04-17 DIAGNOSIS — Z1231 Encounter for screening mammogram for malignant neoplasm of breast: Secondary | ICD-10-CM

## 2024-05-05 ENCOUNTER — Ambulatory Visit
Admission: RE | Admit: 2024-05-05 | Discharge: 2024-05-05 | Disposition: A | Discharge status: Home / Self Care | Payer: Self-pay | Source: Ambulatory Visit | Attending: Family Medicine | Admitting: Family Medicine

## 2024-05-05 ENCOUNTER — Ambulatory Visit: Payer: Self-pay

## 2024-05-05 DIAGNOSIS — Z1231 Encounter for screening mammogram for malignant neoplasm of breast: Secondary | ICD-10-CM | POA: Insufficient documentation

## 2024-05-06 ENCOUNTER — Other Ambulatory Visit: Payer: Self-pay

## 2024-05-06 ENCOUNTER — Telehealth: Payer: Self-pay

## 2024-05-06 DIAGNOSIS — N644 Mastodynia: Secondary | ICD-10-CM

## 2024-05-06 DIAGNOSIS — N6452 Nipple discharge: Secondary | ICD-10-CM

## 2024-05-06 NOTE — Telephone Encounter (Signed)
 [address] Maritza Afanador [facility] Spanish Interpreter) along with Bari NOVAK., [facility] BCCCP Scheduler, Patient went to [facility] today for [employer] Screening mammogram. However, patient stated she told the Mammography Tech that she was having bilateral breast pain, with yellow, green discharge from the right breast, and tech was not able to complete a screening mammogram. Patient has ongoing stabbing breast pain for 2-3 years, comes and goes. Patient stated right breast discharge only occurs when she squeezes the discharge from her right breast as it relieves the pain. Patient was informed that breast pain does not always mean it is breast cancer. There are benign reasons for breast pain. Patient informed will need to be seen in [facility]. Patient offered both [place] and [facility] clinic. Christy, scheduler to check with [facility] to see if can do same day appointment as BCCCP appointment.

## 2024-05-06 NOTE — Addendum Note (Signed)
 Addended by: ROGERIO TEMPIE SQUIBB on: 05/06/2024 02:57 PM   Modules accepted: Orders

## 2024-06-02 ENCOUNTER — Ambulatory Visit: Payer: Self-pay

## 2024-06-02 VITALS — BP 145/82 | Ht <= 58 in | Wt 163.0 lb

## 2024-06-02 DIAGNOSIS — Z87898 Personal history of other specified conditions: Secondary | ICD-10-CM

## 2024-06-02 DIAGNOSIS — N6452 Nipple discharge: Secondary | ICD-10-CM

## 2024-06-02 NOTE — Progress Notes (Signed)
 [name] is a 54 y.o. female who presents to [facility] clinic today with complaint of nipple discharge. Discharge present for more than a year. Initially she felt fullness of breast and self expressed. Right more than left but occurs with both breasts. Describes as clear and green. Minimal now. Is trying to not express discharge now but still notices some crusting on nipple from time to time.    Pap Smear: Pap not smear completed today. Last Pap smear was 05/03/23 at [facility] clinic and was normal. Per patient has no history of an abnormal Pap smear. Last Pap smear result is available in Epic.   Physical exam: Breasts Breasts symmetrical. Right breast inn quadrant at 3:00, 4 cmfn localizes tenderness but no discrete abnormality noted. No skin abnormalities bilateral breasts. No nipple retraction bilateral breasts. No nipple discharge bilateral breasts. No lymphadenopathy. No masses palpated in left breast. Scar noted on abdomen and neck- per patient previous biopsies at [facility]    Pelvic/Bimanual: Pap is not indicated today    Smoking History: Patient has never smoked    Patient Navigation: Patient education provided. Access to services provided for patient through [facility] program. [place] interpreter provided. No transportation provided.   Colorectal Cancer Screening: Per patient has negative cologuard with [place] in 2024 and negative FIT test with BCCCP in 03/11/23. No complaints today.    Breast and Cervical Cancer Risk Assessment: Patient does not have family history of breast cancer, known genetic mutations, or radiation treatment to the chest before age 33. Patient does not have history of cervical dysplasia, immunocompromised, or DES exposure in-utero.  Risk Scores as of Encounter on 06/02/2024     Mandy Ballard as of 03/04/2023           5-year 0.97%   Lifetime 8.5%   This patient is [place]/Latina but has no documented birth country, so the [place] model used data from  [place] patients to calculate their risk score. Document a birth country in the Demographics activity for a more accurate score.         Last calculated by Rogerio Tempie SQUIBB, LPN on 88/08/7973 at 10:33 AM        A: BCCCP exam without pap smear. She is post menopausal. Experiencing discharge. Complains of pain with intercourse and bleeding after.    P: Referred patient to the [facility] for diagnostic mammogram. Appointment scheduled for tomorrow at 1:00. If negative recommend further workup including prolactin levels, renal and thyroid function tests. May consider biopsy.  Pain with intercourse- prescribed premarin by [facility]. Encouraged use and advised that she may need vaginal lubricant with intercourse. If heavier, recommend pelvic exam.   Mandy Tinnie MATSU, NP 06/02/2024

## 2024-06-03 ENCOUNTER — Ambulatory Visit
Admission: RE | Admit: 2024-06-03 | Discharge: 2024-06-03 | Disposition: A | Payer: Self-pay | Source: Ambulatory Visit | Attending: Obstetrics and Gynecology | Admitting: Obstetrics and Gynecology

## 2024-06-03 ENCOUNTER — Ambulatory Visit
Admission: RE | Admit: 2024-06-03 | Discharge: 2024-06-03 | Payer: Self-pay | Attending: Obstetrics and Gynecology | Admitting: Obstetrics and Gynecology

## 2024-06-03 DIAGNOSIS — N6452 Nipple discharge: Secondary | ICD-10-CM

## 2024-06-03 DIAGNOSIS — N644 Mastodynia: Secondary | ICD-10-CM
